# Patient Record
Sex: Male | Born: 1980 | Race: White | Hispanic: No | Marital: Married | Smoking: Former smoker
Health system: Southern US, Community
[De-identification: ages and names within clinical notes are randomized; demographics above are authoritative.]

## PROBLEM LIST (undated history)

## (undated) DIAGNOSIS — W57XXXA Bitten or stung by nonvenomous insect and other nonvenomous arthropods, initial encounter: Secondary | ICD-10-CM

## (undated) DIAGNOSIS — S43439A Superior glenoid labrum lesion of unspecified shoulder, initial encounter: Secondary | ICD-10-CM

## (undated) HISTORY — PX: SHOULDER ARTHROSCOPY W/ LABRAL REPAIR: SHX2399

## (undated) HISTORY — PX: KNEE SURGERY: SHX244

---

## 2010-08-01 ENCOUNTER — Encounter: Admission: RE | Admit: 2010-08-01 | Discharge: 2010-08-01 | Payer: Self-pay | Admitting: Occupational Medicine

## 2011-08-23 IMAGING — CR DG FOOT COMPLETE 3+V*R*
3 series · 3 of 3 positions shown · non-contrast
Comparison: No comparison studies available.

CLINICAL DATA: Crush injury to the right foot.

RIGHT FOOT COMPLETE - 3+ VIEW

[view not recorded (1 of 3)]
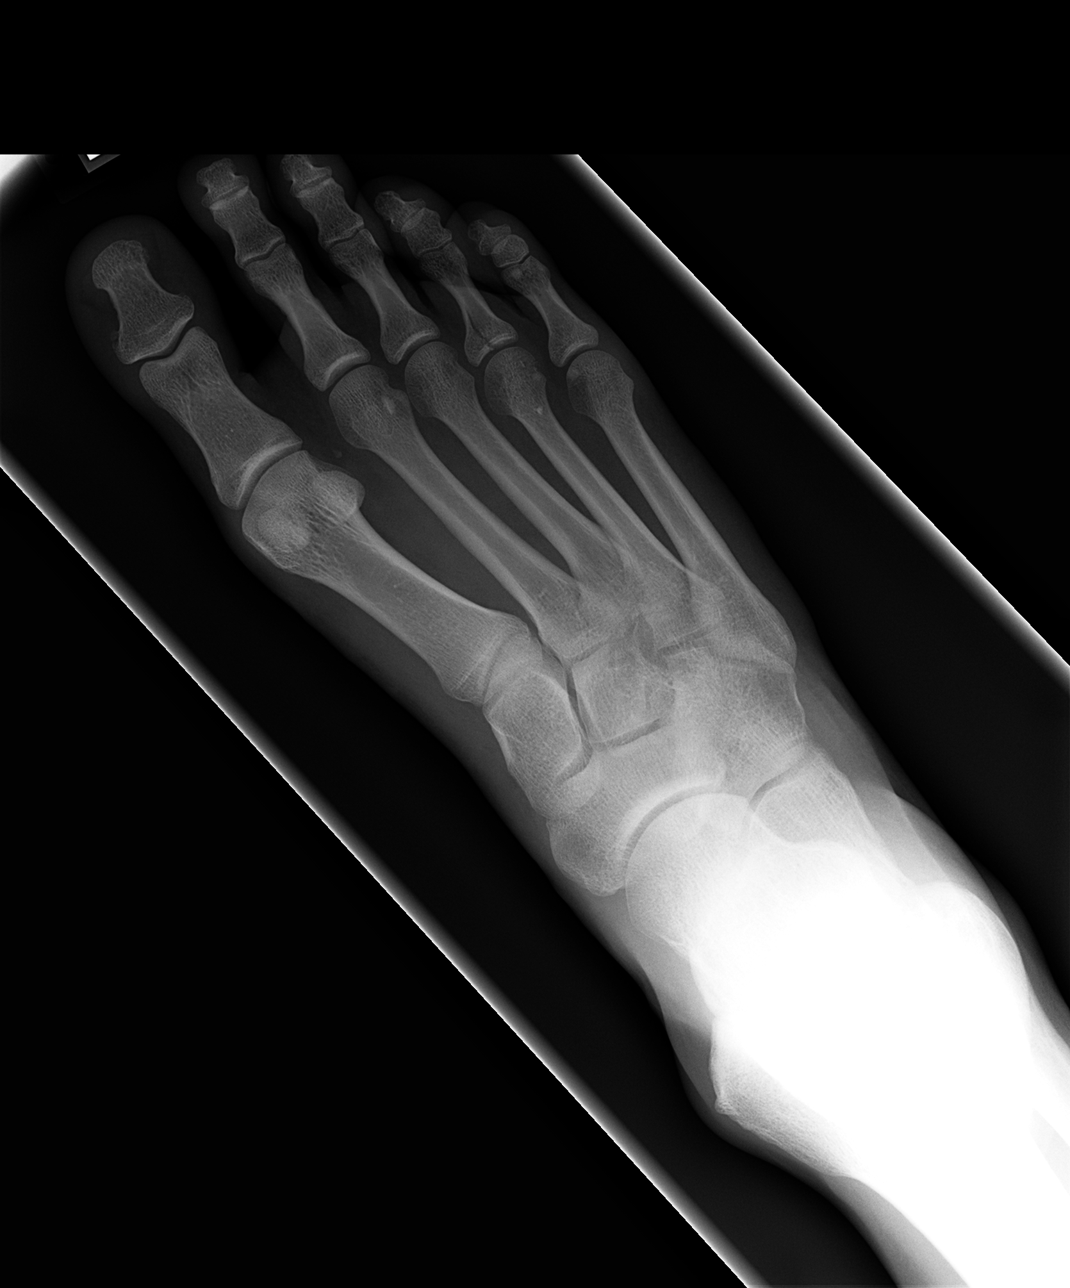

[view not recorded (2 of 3)]
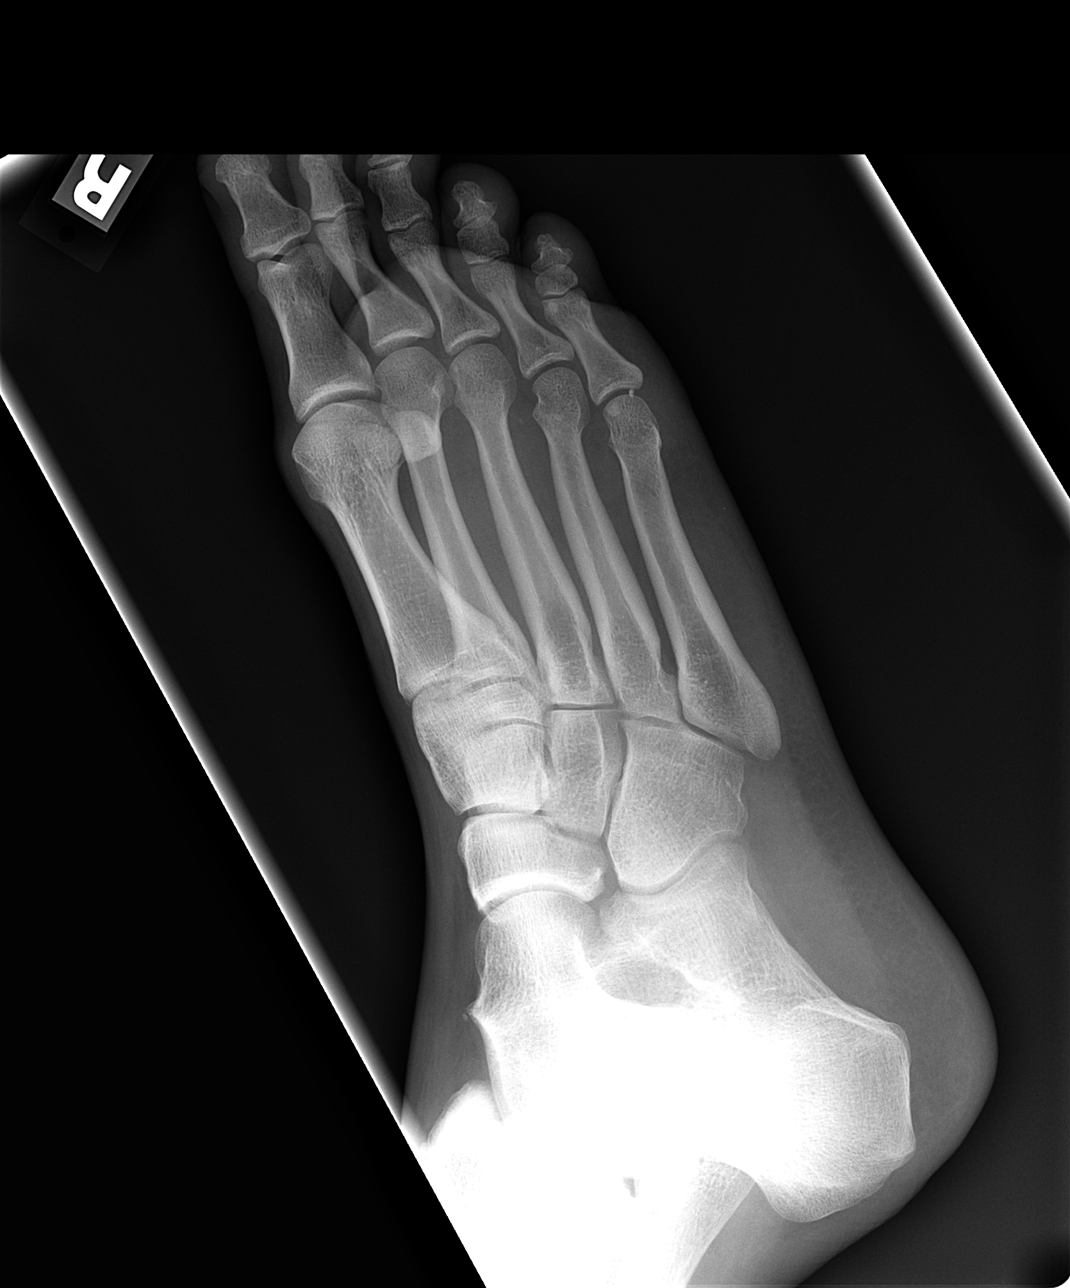

[view not recorded (3 of 3)]
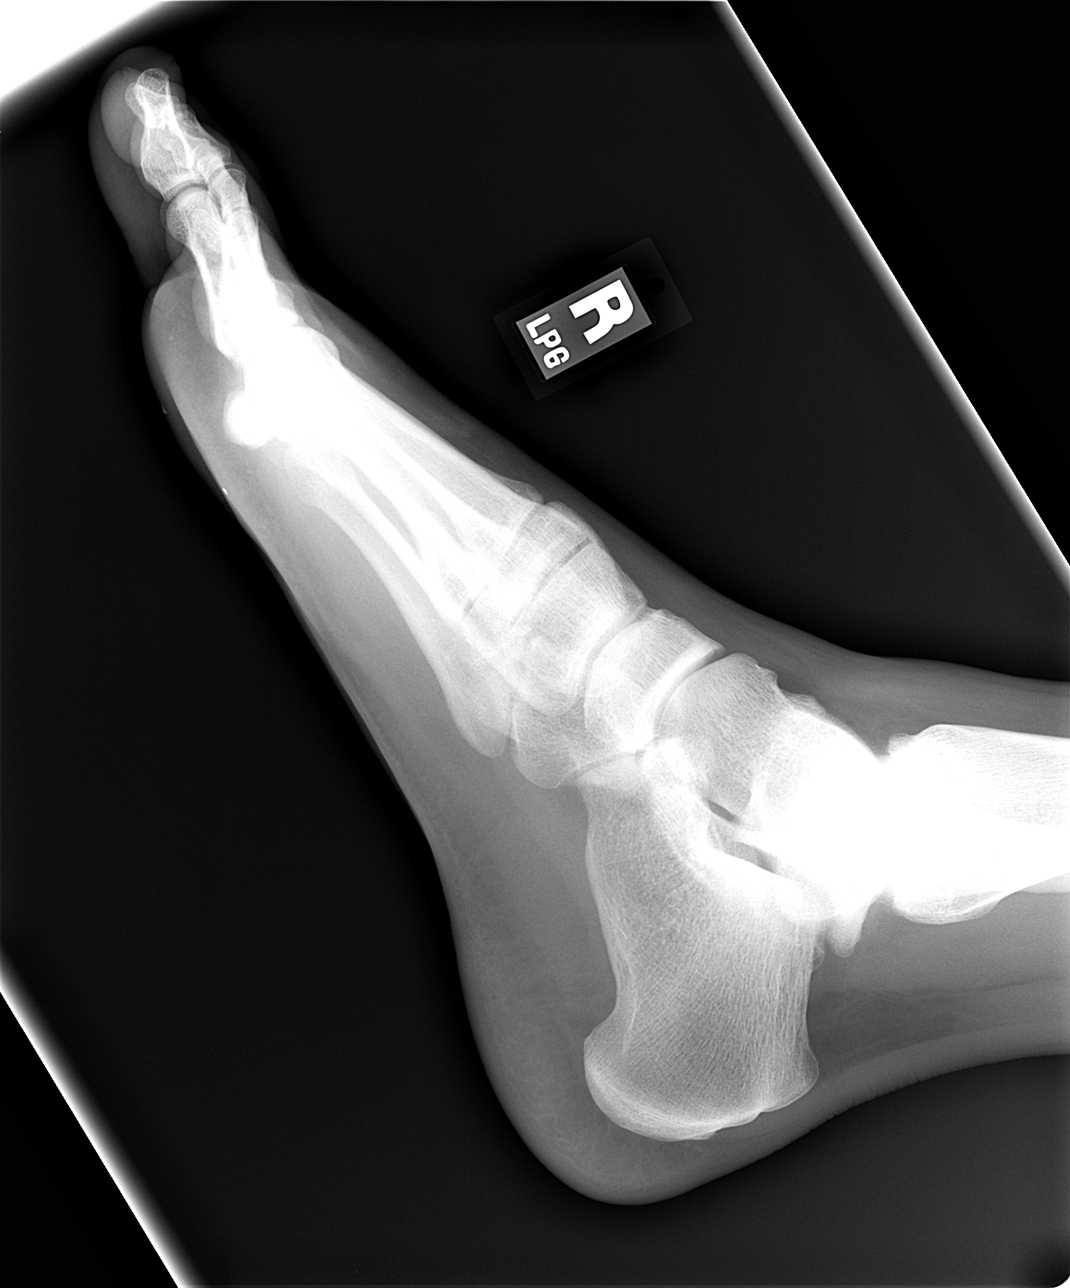

[3 of 3 positions shown; findings below may reference images not displayed]

FINDINGS: Pain sagittally oriented fractures identified at the base
of the fourth toe proximal phalanx.  The fracture line extends to
the articular surface.  No other acute fracture is evident.  The
patient has radiopaque debris along the skin of the plantar aspect
of the foot, at the level of the metatarsal phalangeal joints.
IMPRESSION: Sagittal fracture through the base of the fourth toe proximal
phalanx.

## 2014-04-25 DIAGNOSIS — S43439A Superior glenoid labrum lesion of unspecified shoulder, initial encounter: Secondary | ICD-10-CM

## 2014-04-25 HISTORY — DX: Superior glenoid labrum lesion of unspecified shoulder, initial encounter: S43.439A

## 2014-05-11 ENCOUNTER — Other Ambulatory Visit: Payer: Self-pay | Admitting: Orthopedic Surgery

## 2014-05-24 ENCOUNTER — Encounter (HOSPITAL_BASED_OUTPATIENT_CLINIC_OR_DEPARTMENT_OTHER): Payer: Self-pay | Admitting: *Deleted

## 2014-05-24 DIAGNOSIS — W57XXXA Bitten or stung by nonvenomous insect and other nonvenomous arthropods, initial encounter: Secondary | ICD-10-CM

## 2014-05-24 HISTORY — DX: Bitten or stung by nonvenomous insect and other nonvenomous arthropods, initial encounter: W57.XXXA

## 2014-05-30 ENCOUNTER — Encounter (HOSPITAL_BASED_OUTPATIENT_CLINIC_OR_DEPARTMENT_OTHER)
Admission: RE | Disposition: A | Discharge status: Home / Self Care | Payer: Self-pay | Source: Ambulatory Visit | Attending: Orthopedic Surgery

## 2014-05-30 ENCOUNTER — Encounter (HOSPITAL_BASED_OUTPATIENT_CLINIC_OR_DEPARTMENT_OTHER): Payer: BC Managed Care – PPO | Admitting: Anesthesiology

## 2014-05-30 ENCOUNTER — Ambulatory Visit (HOSPITAL_BASED_OUTPATIENT_CLINIC_OR_DEPARTMENT_OTHER)
Admission: RE | Admit: 2014-05-30 | Discharge: 2014-05-30 | Disposition: A | Discharge status: Home / Self Care | Payer: BC Managed Care – PPO | Source: Ambulatory Visit | Attending: Orthopedic Surgery | Admitting: Orthopedic Surgery

## 2014-05-30 ENCOUNTER — Encounter (HOSPITAL_BASED_OUTPATIENT_CLINIC_OR_DEPARTMENT_OTHER): Payer: Self-pay

## 2014-05-30 ENCOUNTER — Ambulatory Visit (HOSPITAL_BASED_OUTPATIENT_CLINIC_OR_DEPARTMENT_OTHER): Payer: BC Managed Care – PPO | Admitting: Anesthesiology

## 2014-05-30 DIAGNOSIS — F17200 Nicotine dependence, unspecified, uncomplicated: Secondary | ICD-10-CM | POA: Insufficient documentation

## 2014-05-30 DIAGNOSIS — S43432D Superior glenoid labrum lesion of left shoulder, subsequent encounter: Secondary | ICD-10-CM

## 2014-05-30 DIAGNOSIS — M24119 Other articular cartilage disorders, unspecified shoulder: Secondary | ICD-10-CM | POA: Diagnosis not present

## 2014-05-30 DIAGNOSIS — M25519 Pain in unspecified shoulder: Secondary | ICD-10-CM | POA: Diagnosis present

## 2014-05-30 HISTORY — PX: SHOULDER ARTHROSCOPY WITH LABRAL REPAIR: SHX5691

## 2014-05-30 HISTORY — DX: Superior glenoid labrum lesion of unspecified shoulder, initial encounter: S43.439A

## 2014-05-30 HISTORY — DX: Bitten or stung by nonvenomous insect and other nonvenomous arthropods, initial encounter: W57.XXXA

## 2014-05-30 LAB — POCT HEMOGLOBIN-HEMACUE: HEMOGLOBIN: 14.4 g/dL (ref 13.0–17.0)

## 2014-05-30 SURGERY — ARTHROSCOPY, SHOULDER, WITH GLENOID LABRUM REPAIR
Anesthesia: Regional | Site: Shoulder | Laterality: Left

## 2014-05-30 MED ORDER — MIDAZOLAM HCL 2 MG/2ML IJ SOLN
INTRAMUSCULAR | Status: AC
  Filled 2014-05-30: qty 2

## 2014-05-30 MED ORDER — SUCCINYLCHOLINE CHLORIDE 20 MG/ML IJ SOLN
INTRAMUSCULAR | Status: DC | PRN
  Administered 2014-05-30: 100 mg via INTRAVENOUS

## 2014-05-30 MED ORDER — MIDAZOLAM HCL 2 MG/2ML IJ SOLN
1.0000 mg | INTRAMUSCULAR | Status: DC | PRN
  Administered 2014-05-30: 2 mg via INTRAVENOUS

## 2014-05-30 MED ORDER — OXYCODONE HCL 5 MG/5ML PO SOLN: 5.0000 mg | Freq: Once | ORAL | Status: DC | PRN

## 2014-05-30 MED ORDER — FENTANYL CITRATE 0.05 MG/ML IJ SOLN
INTRAMUSCULAR | Status: AC
  Filled 2014-05-30: qty 2

## 2014-05-30 MED ORDER — ONDANSETRON HCL 4 MG/2ML IJ SOLN
INTRAMUSCULAR | Status: DC | PRN
  Administered 2014-05-30: 4 mg via INTRAVENOUS

## 2014-05-30 MED ORDER — DEXAMETHASONE SODIUM PHOSPHATE 4 MG/ML IJ SOLN
INTRAMUSCULAR | Status: DC | PRN
  Administered 2014-05-30: 10 mg via INTRAVENOUS

## 2014-05-30 MED ORDER — SODIUM CHLORIDE 0.9 % IR SOLN
Status: DC | PRN
  Administered 2014-05-30: 6000 mL

## 2014-05-30 MED ORDER — LIDOCAINE HCL (CARDIAC) 20 MG/ML IV SOLN
INTRAVENOUS | Status: DC | PRN
  Administered 2014-05-30: 30 mg via INTRAVENOUS

## 2014-05-30 MED ORDER — FENTANYL CITRATE 0.05 MG/ML IJ SOLN
50.0000 ug | INTRAMUSCULAR | Status: DC | PRN
  Administered 2014-05-30: 100 ug via INTRAVENOUS

## 2014-05-30 MED ORDER — PROPOFOL 10 MG/ML IV BOLUS
INTRAVENOUS | Status: DC | PRN
  Administered 2014-05-30: 200 mg via INTRAVENOUS

## 2014-05-30 MED ORDER — DOCUSATE SODIUM 100 MG PO CAPS: 100.0000 mg | ORAL_CAPSULE | Freq: Three times a day (TID) | ORAL | Status: AC | PRN

## 2014-05-30 MED ORDER — BUPIVACAINE-EPINEPHRINE (PF) 0.5% -1:200000 IJ SOLN
INTRAMUSCULAR | Status: DC | PRN
  Administered 2014-05-30: 30 mL via PERINEURAL

## 2014-05-30 MED ORDER — MIDAZOLAM HCL 2 MG/ML PO SYRP
12.0000 mg | ORAL_SOLUTION | Freq: Once | ORAL | Status: DC | PRN
Start: 2014-05-30 — End: 2014-05-30

## 2014-05-30 MED ORDER — POVIDONE-IODINE 7.5 % EX SOLN: Freq: Once | CUTANEOUS | Status: DC

## 2014-05-30 MED ORDER — OXYCODONE HCL 5 MG PO TABS: 5.0000 mg | ORAL_TABLET | Freq: Once | ORAL | Status: DC | PRN

## 2014-05-30 MED ORDER — OXYCODONE-ACETAMINOPHEN 5-325 MG PO TABS: 1.0000 | ORAL_TABLET | ORAL | Status: AC | PRN

## 2014-05-30 MED ORDER — OXYCODONE HCL 5 MG PO TABS
ORAL_TABLET | ORAL | Status: AC
  Filled 2014-05-30: qty 1

## 2014-05-30 MED ORDER — LACTATED RINGERS IV SOLN
INTRAVENOUS | Status: DC
  Administered 2014-05-30 (×2): via INTRAVENOUS

## 2014-05-30 MED ORDER — CEFAZOLIN SODIUM-DEXTROSE 2-3 GM-% IV SOLR
INTRAVENOUS | Status: AC
  Filled 2014-05-30: qty 50

## 2014-05-30 MED ORDER — HYDROMORPHONE HCL PF 1 MG/ML IJ SOLN: 0.2500 mg | INTRAMUSCULAR | Status: DC | PRN

## 2014-05-30 MED ORDER — ONDANSETRON HCL 4 MG/2ML IJ SOLN: 4.0000 mg | Freq: Four times a day (QID) | INTRAMUSCULAR | Status: DC | PRN

## 2014-05-30 MED ORDER — CEFAZOLIN SODIUM-DEXTROSE 2-3 GM-% IV SOLR
2.0000 g | INTRAVENOUS | Status: AC
  Administered 2014-05-30: 2 g via INTRAVENOUS

## 2014-05-30 SURGICAL SUPPLY — 90 items
ANCHOR SUT BIOCOMP LK 2.9X12.5 (Anchor) ×9 IMPLANT
BENZOIN TINCTURE PRP APPL 2/3 (GAUZE/BANDAGES/DRESSINGS) IMPLANT
BLADE SURG 15 STRL LF DISP TIS (BLADE) IMPLANT
BLADE SURG 15 STRL SS (BLADE)
BLADE SURG ROTATE 9660 (MISCELLANEOUS) IMPLANT
BUR 3.5 LG SPHERICAL (BURR) IMPLANT
BUR OVAL 4.0 (BURR) IMPLANT
BURR 3.5 LG SPHERICAL (BURR)
BURR 3.5MM LG SPHERICAL (BURR)
CANISTER SUCT 3000ML (MISCELLANEOUS) IMPLANT
CANNULA 5.75X71 LONG (CANNULA) ×3 IMPLANT
CANNULA TWIST IN 8.25X7CM (CANNULA) ×3 IMPLANT
CHLORAPREP W/TINT 26ML (MISCELLANEOUS) ×3 IMPLANT
CLOSURE WOUND 1/2 X4 (GAUZE/BANDAGES/DRESSINGS)
DECANTER SPIKE VIAL GLASS SM (MISCELLANEOUS) IMPLANT
DERMABOND ADVANCED (GAUZE/BANDAGES/DRESSINGS)
DERMABOND ADVANCED .7 DNX12 (GAUZE/BANDAGES/DRESSINGS) IMPLANT
DRAPE INCISE IOBAN 66X45 STRL (DRAPES) ×3 IMPLANT
DRAPE STERI 35X30 U-POUCH (DRAPES) ×3 IMPLANT
DRAPE SURG 17X23 STRL (DRAPES) ×3 IMPLANT
DRAPE U 20/CS (DRAPES) ×3 IMPLANT
DRAPE U-SHAPE 47X51 STRL (DRAPES) ×3 IMPLANT
DRAPE U-SHAPE 76X120 STRL (DRAPES) ×6 IMPLANT
DRSG PAD ABDOMINAL 8X10 ST (GAUZE/BANDAGES/DRESSINGS) ×3 IMPLANT
ELECT REM PT RETURN 9FT ADLT (ELECTROSURGICAL) ×3
ELECTRODE REM PT RTRN 9FT ADLT (ELECTROSURGICAL) ×1 IMPLANT
FIBERSTICK 2 (SUTURE) IMPLANT
GAUZE SPONGE 4X4 12PLY STRL (GAUZE/BANDAGES/DRESSINGS) ×3 IMPLANT
GAUZE SPONGE 4X4 16PLY XRAY LF (GAUZE/BANDAGES/DRESSINGS) IMPLANT
GAUZE XEROFORM 1X8 LF (GAUZE/BANDAGES/DRESSINGS) ×3 IMPLANT
GLOVE BIO SURGEON STRL SZ7 (GLOVE) ×3 IMPLANT
GLOVE BIO SURGEON STRL SZ7.5 (GLOVE) ×3 IMPLANT
GLOVE BIOGEL PI IND STRL 7.0 (GLOVE) ×1 IMPLANT
GLOVE BIOGEL PI IND STRL 8 (GLOVE) ×1 IMPLANT
GLOVE BIOGEL PI INDICATOR 7.0 (GLOVE) ×2
GLOVE BIOGEL PI INDICATOR 8 (GLOVE) ×2
GOWN STRL REUS W/ TWL LRG LVL3 (GOWN DISPOSABLE) ×2 IMPLANT
GOWN STRL REUS W/TWL LRG LVL3 (GOWN DISPOSABLE) ×4
GOWN STRL REUS W/TWL XL LVL4 (GOWN DISPOSABLE) ×3 IMPLANT
KIT BIO-SUTURETAK 2.4 SPR TROC (KITS) IMPLANT
KIT PUSHLOCK 2.9 HIP (KITS) ×3 IMPLANT
LASSO 90 CVE QUICKPAS (DISPOSABLE) ×3 IMPLANT
LASSO CRESCENT QUICKPASS (SUTURE) IMPLANT
MANIFOLD NEPTUNE II (INSTRUMENTS) ×3 IMPLANT
NDL SUT 6 .5 CRC .975X.05 MAYO (NEEDLE) IMPLANT
NEEDLE 1/2 CIR CATGUT .05X1.09 (NEEDLE) IMPLANT
NEEDLE MAYO TAPER (NEEDLE)
NS IRRIG 1000ML POUR BTL (IV SOLUTION) IMPLANT
PACK ARTHROSCOPY DSU (CUSTOM PROCEDURE TRAY) ×3 IMPLANT
PACK BASIN DAY SURGERY FS (CUSTOM PROCEDURE TRAY) ×3 IMPLANT
PENCIL BUTTON HOLSTER BLD 10FT (ELECTRODE) IMPLANT
RESECTOR FULL RADIUS 4.2MM (BLADE) ×3 IMPLANT
SHEET MEDIUM DRAPE 40X70 STRL (DRAPES) IMPLANT
SLEEVE SCD COMPRESS KNEE MED (MISCELLANEOUS) ×3 IMPLANT
SLING ARM IMMOBILIZER LRG (SOFTGOODS) ×3 IMPLANT
SLING ARM IMMOBILIZER MED (SOFTGOODS) IMPLANT
SLING ARM LRG ADULT FOAM STRAP (SOFTGOODS) IMPLANT
SLING ARM MED ADULT FOAM STRAP (SOFTGOODS) IMPLANT
SLING ARM XL FOAM STRAP (SOFTGOODS) IMPLANT
SPONGE LAP 4X18 X RAY DECT (DISPOSABLE) IMPLANT
STRIP CLOSURE SKIN 1/2X4 (GAUZE/BANDAGES/DRESSINGS) IMPLANT
SUCTION FRAZIER TIP 10 FR DISP (SUCTIONS) IMPLANT
SUPPORT WRAP ARM LG (MISCELLANEOUS) IMPLANT
SUT ETHIBOND 2 OS 4 DA (SUTURE) IMPLANT
SUT ETHILON 3 0 PS 1 (SUTURE) ×3 IMPLANT
SUT ETHILON 4 0 PS 2 18 (SUTURE) IMPLANT
SUT FIBERWIRE #2 38 T-5 BLUE (SUTURE)
SUT FIBERWIRE 2-0 18 17.9 3/8 (SUTURE)
SUT MNCRL AB 3-0 PS2 18 (SUTURE) IMPLANT
SUT MNCRL AB 4-0 PS2 18 (SUTURE) IMPLANT
SUT PDS AB 0 CT 36 (SUTURE) IMPLANT
SUT PROLENE 3 0 PS 2 (SUTURE) IMPLANT
SUT TIGER TAPE 7 IN WHITE (SUTURE) IMPLANT
SUT VIC AB 0 CT1 27 (SUTURE)
SUT VIC AB 0 CT1 27XBRD ANBCTR (SUTURE) IMPLANT
SUT VIC AB 2-0 SH 27 (SUTURE)
SUT VIC AB 2-0 SH 27XBRD (SUTURE) IMPLANT
SUTURE FIBERWR #2 38 T-5 BLUE (SUTURE) IMPLANT
SUTURE FIBERWR 2-0 18 17.9 3/8 (SUTURE) IMPLANT
SYR BULB 3OZ (MISCELLANEOUS) IMPLANT
TAPE LABRALWHITE 1.5X36 (TAPE) ×3 IMPLANT
TAPE SUT LABRALTAP WHT/BLK (SUTURE) ×6 IMPLANT
TOWEL OR 17X24 6PK STRL BLUE (TOWEL DISPOSABLE) ×3 IMPLANT
TOWEL OR NON WOVEN STRL DISP B (DISPOSABLE) ×3 IMPLANT
TUBE CONNECTING 20'X1/4 (TUBING) ×1
TUBE CONNECTING 20X1/4 (TUBING) ×2 IMPLANT
TUBING ARTHROSCOPY IRRIG 16FT (MISCELLANEOUS) ×3 IMPLANT
WAND STAR VAC 90 (SURGICAL WAND) IMPLANT
WATER STERILE IRR 1000ML POUR (IV SOLUTION) ×3 IMPLANT
YANKAUER SUCT BULB TIP NO VENT (SUCTIONS) IMPLANT

## 2014-05-30 NOTE — Anesthesia Postprocedure Evaluation (Signed)
Anesthesia Post Note  Patient: Sean NimrodMark Bush  Procedure(s) Performed: Procedure(s) (LRB): SHOULDER ARTHROSCOPY WITH LABRAL REPAIR (Left)  Anesthesia type: General  Patient location: PACU  Post pain: Pain level controlled and Adequate analgesia  Post assessment: Post-op Vital signs reviewed, Patient's Cardiovascular Status Stable, Respiratory Function Stable, Patent Airway and Pain level controlled  Last Vitals:  Filed Vitals:   05/30/14 1345  BP: 124/48  Pulse: 70  Temp:   Resp: 18    Post vital signs: Reviewed and stable  Level of consciousness: awake, alert  and oriented  Complications: No apparent anesthesia complications

## 2014-05-30 NOTE — Op Note (Signed)
Procedure(s): SHOULDER ARTHROSCOPY WITH LABRAL REPAIR Procedure Note  Sharyl NimrodMark Markin male 33 y.o. 05/30/2014  Procedure(s) and Anesthesia Type:    * LEFT SHOULDER ARTHROSCOPY WITH POSTERIOR LABRAL REPAIR - General  Surgeon(s) and Role:    * Mable ParisJustin [name] Faylene Allerton, MD - Primary     Surgeon: Mable ParisHANDLER,Elianah Karis [name]   Assistants: Damita Lackanielle Lalibert PA-C [facility](Danielle was present and scrubbed throughout the procedure and was essential in positioning, assisting with the camera and instrumentation,, and closure)  Anesthesia: General endotracheal anesthesia with preoperative interscalene block given by the attending anesthesiologist    Procedure Detail  SHOULDER ARTHROSCOPY WITH LABRAL REPAIR  Estimated Blood Loss: Min         Drains: none  Blood Given: none         Specimens: none        Complications:  * No complications entered in OR log *         Disposition: PACU - hemodynamically stable.         Condition: stable    Procedure:   INDICATIONS FOR SURGERY: The patient is 33 y.o. male who has had multiple injuries to his left shoulder in the past with the most recent 1 point basketball. He had an MRI which revealed Mr. labral tear a small para labral cyst. He was indicated for surgical treatment to decrease pain and instability symptoms. He understood risks benefits alternatives to the procedure was to go forward with surgery.  OPERATIVE FINDINGS: Examination under anesthesia: 2+ posterior laxity compared to 1+ posterior laxity in the contralateral side. No anterior or inferior instability.  Diagnostic Arthroscopy: Rotator cuff was completely intact. Biceps tendon superior labrum and anterior labrum were intact. Glenohumeral joint surfaces were intact. Posterior labrum was torn from about the 10:00 to 6:00 position. It was repaired with 3 2.9 bicomposite push lock anchors with labral tapes.  DESCRIPTION OF PROCEDURE: The patient was identified in preoperative  holding area where  I personally marked the operative site after  verifying site, side, and procedure with the patient. An interscalene block was given by the attending anesthesiologist the holding area.  The patient was taken back to the operating room where general anesthesia was induced without complication and was placed in the beach-chair position with the back  elevated about 60 degrees and all extremities and head and neck carefully padded and  positioned.   The left upper extremity was then prepped and  draped in a standard sterile fashion. The appropriate time-out  procedure was carried out. The patient did receive IV antibiotics  within 30 minutes of incision.   A small high lateral posterior portal incision was made and the arthroscope was introduced into the joint. An anterior portal was then established above the subscapularis using needle localization. Small cannula was placed anteriorly. Diagnostic arthroscopy was then carried out with findings as described above.  Once the diagnosis of posterior labral tear was confirmed and no other pathology was found a high lateral rotator interval portal was established the camera was moved to this position. A large cannula was placed then in the posterior portal position and was noted to be of appropriate position and angle for repair the posterior labrum. The arm was slightly externally rotated. The arthroscopic elevator was used to completely free up the tear from about the 10:00 to 6:00 position. Shaver and rasp were used to promote healing. A 90 suture lasso was then used to pass a labral tape through the inferior capsular ligamentous tissue in the inferior most  aspect of the labral tear and then was brought to an anchor at about the 7:00 position reconstituting the tension of the posterior inferior glenohumeral  ligament nicely. 2 additional anchors were then placed in the similar fashion advancing the tissue laterally and superiorly with each anchor. This  completely repaired the posterior labral tear. Anchors and sutures were felt to be in excellent position.   The arthroscopic equipment was removed from the joint and the portals were closed with 3-0 nylon in an interrupted fashion. Sterile dressings were then applied including Xeroform 4 x 4's ABDs and tape. The patient was then allowed to awaken from general anesthesia, placed in a sling, transferred to the stretcher and taken to the recovery room in stable condition.   POSTOPERATIVE PLAN: The patient will be discharged home today and will followup in one week for suture removal and wound check.  He'll follow the standard labral protocol.

## 2014-05-30 NOTE — Anesthesia Procedure Notes (Addendum)
Anesthesia Regional Block:  Interscalene brachial plexus block  Pre-Anesthetic Checklist: ,, timeout performed, Correct Patient, Correct Site, Correct Laterality, Correct Procedure, Correct Position, site marked, Risks and benefits discussed,  Surgical consent,  Pre-op evaluation,  At surgeon's request and post-op pain management  Laterality: Left  Prep: chloraprep       Needles:  Injection technique: Single-shot  Needle Type: Echogenic Stimulator Needle     Needle Length: 5cm 5 cm Needle Gauge: 22 and 22 G    Additional Needles:  Procedures: ultrasound guided (picture in chart) and nerve stimulator Interscalene brachial plexus block  Nerve Stimulator or Paresthesia:  Response: biceps flexion, 0.45 mA,   Additional Responses:   Narrative:  Start time: 05/30/2014 11:30 AM End time: 05/30/2014 11:36 AM Injection made incrementally with aspirations every 5 mL.  Performed by: Personally  Anesthesiologist: Dr Marcie Bal  Additional Notes: Functioning IV was confirmed and monitors were applied.  A 70m 22ga Arrow echogenic stimulator needle was used. Sterile prep and drape,hand hygiene and sterile gloves were used.  Negative aspiration and negative test dose prior to incremental administration of local anesthetic. The patient tolerated the procedure well.  Ultrasound guidance: relevent anatomy identified, needle position confirmed, local anesthetic spread visualized around nerve(s), vascular puncture avoided.  Image printed for medical record.    Procedure Name: Intubation Date/Time: 05/30/2014 12:02 PM Performed by: Jori Frerichs Pre-anesthesia Checklist: Patient identified, Emergency Drugs available, Suction available and Patient being monitored Patient Re-evaluated:Patient Re-evaluated prior to inductionOxygen Delivery Method: Circle System Utilized Preoxygenation: Pre-oxygenation with 100% oxygen Intubation Type: IV induction Ventilation: Mask ventilation without  difficulty Laryngoscope Size: Mac and 3 Grade View: Grade I Tube type: Oral Tube size: 8.0 mm Number of attempts: 1 Airway Equipment and Method: stylet and LTA kit utilized Placement Confirmation: ETT inserted through vocal cords under direct vision,  positive ETCO2 and breath sounds checked- equal and bilateral Secured at: 21 cm Tube secured with: Tape Dental Injury: Teeth and Oropharynx as per pre-operative assessment

## 2014-05-30 NOTE — Anesthesia Preprocedure Evaluation (Signed)
Anesthesia Evaluation  Patient identified by MRN, date of birth, ID band Patient awake    Reviewed: Allergy & Precautions, H&P , NPO status , Patient's Chart, lab work & pertinent test results  Airway Mallampati: II  Neck ROM: full    Dental   Pulmonary Current Smoker,          Cardiovascular negative cardio ROS      Neuro/Psych    GI/Hepatic   Endo/Other  obese  Renal/GU      Musculoskeletal   Abdominal   Peds  Hematology   Anesthesia Other Findings   Reproductive/Obstetrics                           Anesthesia Physical Anesthesia Plan  ASA: II  Anesthesia Plan: General and Regional   Post-op Pain Management: MAC Combined w/ Regional for Post-op pain   Induction: Intravenous  Airway Management Planned: Oral ETT  Additional Equipment:   Intra-op Plan:   Post-operative Plan: Extubation in OR  Informed Consent: I have reviewed the patients History and Physical, chart, labs and discussed the procedure including the risks, benefits and alternatives for the proposed anesthesia with the patient or authorized representative who has indicated his/her understanding and acceptance.     Plan Discussed with: CRNA, Anesthesiologist and Surgeon  Anesthesia Plan Comments:         Anesthesia Quick Evaluation

## 2014-05-30 NOTE — Progress Notes (Signed)
Assisted Dr. Hodierne with left, ultrasound guided, interscalene  block. Side rails up, monitors on throughout procedure. See vital signs in flow sheet. Tolerated Procedure well. 

## 2014-05-30 NOTE — Discharge Instructions (Signed)
Discharge Instructions after Arthroscopic Shoulder Repair ° ° °A sling has been provided for you. Remain in your sling at all times. This includes sleeping in your sling.  °Use ice on the shoulder intermittently over the first 48 hours after surgery.  °Pain medicine has been prescribed for you.  °Use your medicine liberally over the first 48 hours, and then you can begin to taper your use. You may take Extra Strength Tylenol or Tylenol only in place of the pain pills. DO NOT take ANY nonsteroidal anti-inflammatory pain medications: Advil, Motrin, Ibuprofen, Aleve, Naproxen, or Narprosyn.  °You may remove your dressing after two days. If the incision sites are still moist, place a Band-Aid over the moist site(s). Change Band-Aids daily until dry.  °You may shower 5 days after surgery. The incisions CANNOT get wet prior to 5 days. Simply allow the water to wash over the site and then pat dry. Do not rub the incisions. Make sure your axilla (armpit) is completely dry after showering.  °Take one aspirin a day for 2 weeks after surgery, unless you have an aspirin sensitivity/ allergy or asthma. ° ° °Please call [phone] during normal business hours or [phone] after hours for any problems. Including the following: ° °- excessive redness of the incisions °- drainage for more than 4 days °- fever of more than 101.5 F ° °*Please note that pain medications will not be refilled after hours or on weekends. ° ° °Post Anesthesia Home Care Instructions ° °Activity: °Get plenty of rest for the remainder of the day. A responsible adult should stay with you for 24 hours following the procedure.  °For the next 24 hours, DO NOT: °-Drive a car °-Operate machinery °-Drink alcoholic beverages °-Take any medication unless instructed by your physician °-Make any legal decisions or sign important papers. ° °Meals: °Start with liquid foods such as gelatin or soup. Progress to regular foods as tolerated. Avoid greasy, spicy, heavy  foods. If nausea and/or vomiting occur, drink only clear liquids until the nausea and/or vomiting subsides. Call your physician if vomiting continues. ° °Special Instructions/Symptoms: °Your throat may feel dry or sore from the anesthesia or the breathing tube placed in your throat during surgery. If this causes discomfort, gargle with warm salt water. The discomfort should disappear within 24 hours. ° ° °

## 2014-05-30 NOTE — Transfer of Care (Signed)
Immediate Anesthesia Transfer of Care Note  Patient: Sean NimrodMark Craighead  Procedure(s) Performed: Procedure(s) with comments: SHOULDER ARTHROSCOPY WITH LABRAL REPAIR (Left) - Left shoulder arthroscopy posterior labral repair  Patient Location: PACU  Anesthesia Type:GA combined with regional for post-op pain  Level of Consciousness: awake and sedated  Airway & Oxygen Therapy: Patient Spontanous Breathing and Patient connected to face mask oxygen  Post-op Assessment: Report given to PACU RN and Post -op Vital signs reviewed and stable  Post vital signs: Reviewed and stable  Complications: No apparent anesthesia complications

## 2014-05-30 NOTE — H&P (Signed)
Sean Bush is an 33 y.o. male.   Chief Complaint: L shoulder pain  HPI: L shoulder posterior labral tear, failed conservative treatment.  Past Medical History  Diagnosis Date  . Labral tear of shoulder 04/2014    left posterior  . Insect bites 05/24/2014    Past Surgical History  Procedure Laterality Date  . Shoulder arthroscopy w/ labral repair Right     History reviewed. No pertinent family history. Social History:  reports that he has been smoking Cigarettes.  He has been smoking about 0.00 packs per day for the past 5 years. His smokeless tobacco use includes Chew. He reports that he drinks alcohol. He reports that he does not use illicit drugs.  Allergies: No Known Allergies  No prescriptions prior to admission    No results found for this or any previous visit (from the past 48 hour(s)). No results found.  Review of Systems  All other systems reviewed and are negative.   Blood pressure 128/74, pulse 57, temperature 98 F (36.7 C), temperature source Oral, resp. rate 17, height 6\' 4"  (1.93 m), weight 114.306 kg (252 lb), SpO2 100.00%. Physical Exam  Constitutional: He is oriented to person, place, and time. He appears well-developed and well-nourished.  HENT:  Head: Atraumatic.  Eyes: EOM are normal.  Cardiovascular: Intact distal pulses.   Respiratory: Effort normal.  Musculoskeletal:  L shoulder pain with posterior stress, no weakness.  Neurological: He is alert and oriented to person, place, and time.  Skin: Skin is warm and dry.  Psychiatric: He has a normal mood and affect.     Assessment/Plan L shoulder posterior labral tear Plan arth post labral repair. Risks / benefits of surgery discussed Consent on chart  NPO for OR Preop antibiotics   Sean Bush Sean Bush 05/30/2014, 11:52 AM

## 2014-06-03 ENCOUNTER — Encounter (HOSPITAL_BASED_OUTPATIENT_CLINIC_OR_DEPARTMENT_OTHER): Payer: Self-pay | Admitting: Orthopedic Surgery

## 2014-06-15 ENCOUNTER — Encounter (HOSPITAL_BASED_OUTPATIENT_CLINIC_OR_DEPARTMENT_OTHER): Payer: Self-pay | Admitting: Orthopedic Surgery

## 2018-05-12 ENCOUNTER — Ambulatory Visit (HOSPITAL_COMMUNITY)
Admission: RE | Admit: 2018-05-12 | Discharge: 2018-05-12 | Disposition: A | Discharge status: Home / Self Care | Payer: BLUE CROSS/BLUE SHIELD | Source: Ambulatory Visit | Attending: Obstetrics and Gynecology | Admitting: Obstetrics and Gynecology

## 2020-08-24 ENCOUNTER — Other Ambulatory Visit: Payer: Self-pay

## 2020-08-24 DIAGNOSIS — Z20822 Contact with and (suspected) exposure to covid-19: Secondary | ICD-10-CM

## 2020-08-25 LAB — SARS-COV-2, NAA 2 DAY TAT

## 2020-08-25 LAB — NOVEL CORONAVIRUS, NAA: SARS-CoV-2, NAA: NOT DETECTED

## 2020-09-14 ENCOUNTER — Other Ambulatory Visit: Payer: Self-pay

## 2020-09-14 DIAGNOSIS — Z20822 Contact with and (suspected) exposure to covid-19: Secondary | ICD-10-CM

## 2020-09-16 LAB — SARS-COV-2, NAA 2 DAY TAT

## 2020-09-16 LAB — NOVEL CORONAVIRUS, NAA: SARS-CoV-2, NAA: NOT DETECTED

## 2021-06-06 ENCOUNTER — Ambulatory Visit: Payer: Self-pay | Attending: Critical Care Medicine

## 2021-06-06 DIAGNOSIS — Z20822 Contact with and (suspected) exposure to covid-19: Secondary | ICD-10-CM | POA: Insufficient documentation

## 2021-06-07 LAB — SARS-COV-2, NAA 2 DAY TAT

## 2021-06-07 LAB — NOVEL CORONAVIRUS, NAA: SARS-CoV-2, NAA: NOT DETECTED

## 2021-07-04 ENCOUNTER — Encounter (HOSPITAL_BASED_OUTPATIENT_CLINIC_OR_DEPARTMENT_OTHER): Payer: Self-pay

## 2021-07-04 DIAGNOSIS — G4700 Insomnia, unspecified: Secondary | ICD-10-CM

## 2021-08-19 ENCOUNTER — Ambulatory Visit (HOSPITAL_BASED_OUTPATIENT_CLINIC_OR_DEPARTMENT_OTHER): Payer: Self-pay | Attending: Infectious Diseases | Admitting: Internal Medicine

## 2023-02-15 ENCOUNTER — Ambulatory Visit (INDEPENDENT_AMBULATORY_CARE_PROVIDER_SITE_OTHER): Payer: No Typology Code available for payment source

## 2023-02-15 ENCOUNTER — Ambulatory Visit
Admission: EM | Admit: 2023-02-15 | Discharge: 2023-02-15 | Disposition: A | Discharge status: Home / Self Care | Payer: No Typology Code available for payment source | Attending: Emergency Medicine | Admitting: Emergency Medicine

## 2023-02-15 DIAGNOSIS — M25532 Pain in left wrist: Secondary | ICD-10-CM

## 2023-02-15 NOTE — Discharge Instructions (Addendum)
Take ibuprofen or Tylenol as directed.  Rest and elevate your wrist.  Apply ice packs 2-3 times a day for up to 20 minutes each.  Wear the wrist brace as needed for comfort.    Follow up with an orthopedist.

## 2023-02-15 NOTE — ED Provider Notes (Signed)
Roderic Palau    CSN: [id number] Arrival date & time: 02/15/23  1106      History   Chief Complaint Chief Complaint  Patient presents with   Wrist Pain    Sprain possible hairline, want xray.[facility] - Entered by patient    HPI Sean Bush is a 42 y.o. male.  Patient presents with left wrist pain x 5 weeks after he fell while playing basketball.  This morning, he fell again while chasing a chicken and landed on his wrist.  He has pain with movement.  No open wounds, bruising, redness, numbness, weakness, or other symptoms.  No OTC medications.   The history is provided by the patient and medical records.    Past Medical History:  Diagnosis Date   Insect bites 05/24/2014   Labral tear of shoulder 04/2014   left posterior    Patient Active Problem List   Diagnosis Date Noted   Wrist pain, acute, left 02/15/2023    Past Surgical History:  Procedure Laterality Date   KNEE SURGERY Bilateral    2020/ 2018   SHOULDER ARTHROSCOPY W/ LABRAL REPAIR Right    SHOULDER ARTHROSCOPY WITH LABRAL REPAIR Left 05/30/2014   Procedure: SHOULDER ARTHROSCOPY WITH LABRAL REPAIR;  Surgeon: Nita Sells, MD;  Location: [place];  Service: Orthopedics;  Laterality: Left;  Left shoulder arthroscopy posterior labral repair       Home Medications    Prior to Admission medications   Medication Sig Start Date End Date Taking? Authorizing Provider  buPROPion (WELLBUTRIN XL) 150 MG 24 hr tablet TAKE ONE TABLET BY MOUTH EVERY MORNING FOR DEPRESSION 01/16/23  Yes [provider]  cyclobenzaprine (FLEXERIL) 10 MG tablet TAKE ONE TABLET BY MOUTH DAILY AS NEEDED FOR MUSCLE SPASM 07/22/22 07/23/23 Yes [provider]  FLUoxetine (PROZAC) 20 MG capsule Take 1 tablet by mouth daily. 11/22/22  Yes [provider]  fluticasone (FLONASE) 50 MCG/ACT nasal spray INSTILL 2 SPRAYS IN EACH NOSTRIL DAILY AS NEEDED FOR ALLERGIES 07/22/22  07/23/23 Yes [provider]  hydrOXYzine (ATARAX) 10 MG tablet TAKE ONE TABLET BY MOUTH EVERY 6 HOURS AS NEEDED FOR ANXIETY/AGITATION 11/22/22  Yes [provider]  docusate sodium (COLACE) 100 MG capsule Take 1 capsule (100 mg total) by mouth 3 (three) times daily as needed. Patient not taking: Reported on 02/15/2023 05/30/14   Grier Mitts, PA-C  oxyCODONE-acetaminophen (ROXICET) 5-325 MG per tablet Take 1-2 tablets by mouth every 4 (four) hours as needed for severe pain. Patient not taking: Reported on 02/15/2023 05/30/14   Grier Mitts, PA-C    Family History History reviewed. No pertinent family history.  Social History Social History   Tobacco Use   Smoking status: Every Day    Years: 5    Types: Cigarettes   Smokeless tobacco: Current    Types: Chew   Tobacco comments:    1 cig./day  Substance Use Topics   Alcohol use: Yes    Comment: occasionally   Drug use: No     Allergies   Patient has no known allergies.   Review of Systems Review of Systems  Constitutional:  Negative for chills and fever.  Musculoskeletal:  Positive for arthralgias. Negative for joint swelling.  Skin:  Negative for color change, rash and wound.  Neurological:  Negative for weakness and numbness.  All other systems reviewed and are negative.    Physical Exam Triage Vital Signs ED Triage Vitals  Enc Vitals Group  BP 02/15/23 1131 136/85     Pulse Rate 02/15/23 1124 61     Resp 02/15/23 1124 18     Temp 02/15/23 1124 98 F (36.7 C)     Temp src --      SpO2 02/15/23 1124 97 %     Weight 02/15/23 1128 230 lb (104.3 kg)     Height 02/15/23 1128 6\' 4"  (1.93 m)     Head Circumference --      Peak Flow --      Pain Score 02/15/23 1128 3     Pain Loc --      Pain Edu? --      Excl. in [place]? --    No data found.  Updated Vital Signs BP 136/85   Pulse 61   Temp 98 F (36.7 C)   Resp 18   Ht 6\' 4"  (1.93 m)   Wt 230 lb (104.3 kg)   SpO2 97%   BMI  28.00 kg/m   Visual Acuity Right Eye Distance:   Left Eye Distance:   Bilateral Distance:    Right Eye Near:   Left Eye Near:    Bilateral Near:     Physical Exam Vitals and nursing note reviewed.  Constitutional:      General: He is not in acute distress.    Appearance: Normal appearance. He is well-developed. He is not ill-appearing.  HENT:     Mouth/Throat:     Mouth: Mucous membranes are moist.  Cardiovascular:     Rate and Rhythm: Normal rate and regular rhythm.  Pulmonary:     Effort: Pulmonary effort is normal. No respiratory distress.  Musculoskeletal:        General: Tenderness present. No swelling or deformity.       Arms:     Cervical back: Neck supple.  Skin:    General: Skin is warm and dry.     Capillary Refill: Capillary refill takes less than 2 seconds.     Findings: No bruising, erythema, lesion or rash.  Neurological:     General: No focal deficit present.     Mental Status: He is alert and oriented to person, place, and time.     Sensory: No sensory deficit.     Motor: No weakness.  Psychiatric:        Mood and Affect: Mood normal.        Behavior: Behavior normal.      UC Treatments / Results  Labs (all labs ordered are listed, but only abnormal results are displayed) Labs Reviewed - No data to display  EKG   Radiology DG Wrist Complete Left  Result Date: 02/15/2023 CLINICAL DATA:  Left wrist pain after injury 5 weeks ago. EXAM: LEFT WRIST - COMPLETE 3+ VIEW COMPARISON:  None Available. FINDINGS: There is no evidence of fracture or dislocation. There is no evidence of arthropathy or other focal bone abnormality. Soft tissues are unremarkable. IMPRESSION: Negative. Electronically Signed   By: Marijo Conception M.D.   On: 02/15/2023 11:48    Procedures Procedures (including critical care time)  Medications Ordered in UC Medications - No data to display  Initial Impression / Assessment and Plan / UC Course  I have reviewed the triage  vital signs and the nursing notes.  Pertinent labs & imaging results that were available during my care of the patient were reviewed by me and considered in my medical decision making (see chart for details).   Left wrist pain.  Xray negative.  Treating with wrist brace, rest, elevation, ice packs, ibuprofen or Tylenol.  Education provided on wrist pain.  Instructed patient to follow-up with orthopedist.  Contact information for on-call Ortho provided but patient has seen [facility] in the past and may follow up there.  He agrees to plan of care.    Final Clinical Impressions(s) / UC Diagnoses   Final diagnoses:  Wrist pain, acute, left     Discharge Instructions      Take ibuprofen or Tylenol as directed.  Rest and elevate your wrist.  Apply ice packs 2-3 times a day for up to 20 minutes each.  Wear the wrist brace as needed for comfort.    Follow up with an orthopedist.        ED Prescriptions   None    PDMP not reviewed this encounter.   Sharion Balloon, NP 02/15/23 [phone]

## 2023-02-15 NOTE — ED Triage Notes (Signed)
Patient to Urgent Care with complaints of left sided wrist pain following an injury five weeks ago. Reports he fell multiple times when playing basketball. States that this morning he was chasing a chicken, slipped and fell and caught himself again on his left wrist.  Reports pain has not improved over the last five weeks. Significant pain when flexing. Denies any swelling.

## 2023-06-15 ENCOUNTER — Ambulatory Visit
Admission: EM | Admit: 2023-06-15 | Discharge: 2023-06-15 | Disposition: A | Discharge status: Home / Self Care | Payer: No Typology Code available for payment source | Attending: Emergency Medicine | Admitting: Emergency Medicine

## 2023-06-15 ENCOUNTER — Ambulatory Visit (INDEPENDENT_AMBULATORY_CARE_PROVIDER_SITE_OTHER): Payer: No Typology Code available for payment source

## 2023-06-15 DIAGNOSIS — S62651A Nondisplaced fracture of medial phalanx of left index finger, initial encounter for closed fracture: Secondary | ICD-10-CM

## 2023-06-15 DIAGNOSIS — M79645 Pain in left finger(s): Secondary | ICD-10-CM | POA: Diagnosis not present

## 2023-06-15 DIAGNOSIS — M79671 Pain in right foot: Secondary | ICD-10-CM

## 2023-06-15 DIAGNOSIS — M7731 Calcaneal spur, right foot: Secondary | ICD-10-CM

## 2023-06-15 NOTE — ED Provider Notes (Signed)
Sean Bush    CSN: [id number] Arrival date & time: 06/15/23  7829      History   Chief Complaint Chief Complaint  Patient presents with   Ankle Pain        Finger Injury    HPI Sean Bush is a 42 y.o. male.   42 year old male pt, Sean Bush, presents to Urgent Care, for evaluation of right heel and left index finger pain injured playing 06/14/2023. Pt states he was playing basketball and "jammed" left index finger and right heel started hurting. Pt denies rolling his ankle.  The history is provided by the patient. No language interpreter was used.    Past Medical History:  Diagnosis Date   Insect bites 05/24/2014   Labral tear of shoulder 04/2014   left posterior    Patient Active Problem List   Diagnosis Date Noted   Finger pain, left 06/15/2023   Pain of right heel 06/15/2023   Closed nondisplaced fracture of middle phalanx of left index finger 06/15/2023   Heel spur, right 06/15/2023   Wrist pain, acute, left 02/15/2023    Past Surgical History:  Procedure Laterality Date   KNEE SURGERY Bilateral    2020/ 2018   SHOULDER ARTHROSCOPY W/ LABRAL REPAIR Right    SHOULDER ARTHROSCOPY WITH LABRAL REPAIR Left 05/30/2014   Procedure: SHOULDER ARTHROSCOPY WITH LABRAL REPAIR;  Surgeon: Mable Paris, MD;  Location: [facility];  Service: Orthopedics;  Laterality: Left;  Left shoulder arthroscopy posterior labral repair       Home Medications    Prior to Admission medications   Medication Sig Start Date End Date Taking? Authorizing Provider  buPROPion (WELLBUTRIN XL) 150 MG 24 hr tablet TAKE ONE TABLET BY MOUTH EVERY MORNING FOR DEPRESSION 01/16/23  Yes [provider]  cyclobenzaprine (FLEXERIL) 10 MG tablet TAKE ONE TABLET BY MOUTH DAILY AS NEEDED FOR MUSCLE SPASM 07/22/22 07/23/23 Yes [provider]  fluticasone (FLONASE) 50 MCG/ACT nasal spray INSTILL 2 SPRAYS IN EACH NOSTRIL DAILY AS NEEDED FOR ALLERGIES  07/22/22 07/23/23 Yes [provider]  hydrOXYzine (ATARAX) 10 MG tablet TAKE ONE TABLET BY MOUTH EVERY 6 HOURS AS NEEDED FOR ANXIETY/AGITATION 11/22/22  Yes [provider]  docusate sodium (COLACE) 100 MG capsule Take 1 capsule (100 mg total) by mouth 3 (three) times daily as needed. Patient not taking: Reported on 02/15/2023 05/30/14   Jiles Harold, PA-C  FLUoxetine (PROZAC) 20 MG capsule Take 1 tablet by mouth daily. 11/22/22   [provider]  oxyCODONE-acetaminophen (ROXICET) 5-325 MG per tablet Take 1-2 tablets by mouth every 4 (four) hours as needed for severe pain. Patient not taking: Reported on 02/15/2023 05/30/14   Jiles Harold, PA-C    Family History History reviewed. No pertinent family history.  Social History Social History   Tobacco Use   Smoking status: Former    Types: Cigarettes   Smokeless tobacco: Current    Types: Chew   Tobacco comments:    1 cig./day  Vaping Use   Vaping status: Never Used  Substance Use Topics   Alcohol use: Not Currently    Comment: occasionally   Drug use: No     Allergies   Patient has no known allergies.   Review of Systems Review of Systems  Constitutional:  Negative for fever.  Musculoskeletal:  Positive for arthralgias, gait problem and joint swelling.  Skin:  Positive for color change.  All other systems reviewed and are negative.  Physical Exam Triage Vital Signs ED Triage Vitals  Encounter Vitals Group     BP 06/15/23 0944 (!) 145/94     Systolic BP Percentile --      Diastolic BP Percentile --      Pulse Rate 06/15/23 0944 (!) 52     Resp 06/15/23 0944 14     Temp 06/15/23 0944 (!) 97.5 F (36.4 C)     Temp Source 06/15/23 0944 Temporal     SpO2 06/15/23 0944 98 %     Weight 06/15/23 0949 250 lb (113.4 kg)     Height 06/15/23 0949 6\' 4"  (1.93 m)     Head Circumference --      Peak Flow --      Pain Score 06/15/23 0949 8     Pain Loc --      Pain Education --       Exclude from Growth Chart --    No data found.  Updated Vital Signs BP (!) 145/94 (BP Location: Left Arm)   Pulse (!) 52   Temp (!) 97.5 F (36.4 C) (Temporal)   Resp 14   Ht 6\' 4"  (1.93 m)   Wt 250 lb (113.4 kg)   SpO2 98%   BMI 30.43 kg/m   Visual Acuity Right Eye Distance:   Left Eye Distance:   Bilateral Distance:    Right Eye Near:   Left Eye Near:    Bilateral Near:     Physical Exam Vitals and nursing note reviewed.  Constitutional:      Appearance: Normal appearance. He is well-developed and well-groomed.  HENT:     Head: Normocephalic.  Cardiovascular:     Rate and Rhythm: Regular rhythm. Bradycardia present.     Pulses: Normal pulses.     Heart sounds: Normal heart sounds.  Pulmonary:     Effort: Pulmonary effort is normal.  Musculoskeletal:     Left hand: Swelling and bony tenderness present. Decreased range of motion. Normal strength. Normal sensation. There is no disruption of two-point discrimination. Normal capillary refill. Normal pulse.       Feet:     Comments: Left index finger discolored,swollen at MIP  Feet:     Comments: No swelling,no erythema, Dp +2 Neurological:     General: No focal deficit present.     Mental Status: He is alert and oriented to person, place, and time.     GCS: GCS eye subscore is 4. GCS verbal subscore is 5. GCS motor subscore is 6.     Sensory: Sensation is intact.     Coordination: Coordination is intact.     Gait: Gait is intact.     Comments: Decreased ROM  Psychiatric:        Attention and Perception: Attention normal.        Mood and Affect: Mood normal.        Speech: Speech normal.        Behavior: Behavior normal. Behavior is cooperative.     UC Treatments / Results  Labs (all labs ordered are listed, but only abnormal results are displayed) Labs Reviewed - No data to display  EKG   Radiology DG Finger Index Left  Result Date: 06/15/2023 CLINICAL DATA:  Pain.  Jammed finger playing basketball.  EXAM: LEFT INDEX FINGER 2+V COMPARISON:  None Available. FINDINGS: There is a nondisplaced, intra-articular fracture through the volar aspect of the base of the middle phalanx of the index finger. Soft tissue swelling is noted.  There is no dislocation. IMPRESSION: Nondisplaced intra-articular fracture of the index finger middle phalanx. Electronically Signed   By: Sebastian Ache M.D.   On: 06/15/2023 10:45   DG Foot Complete Right  Result Date: 06/15/2023 CLINICAL DATA:  Right foot/heel pain. EXAM: RIGHT FOOT COMPLETE - 3+ VIEW COMPARISON:  Right foot radiographs 08/01/2010 FINDINGS: No acute fracture or dislocation is identified. A small posterior calcaneal spur is noted. Mild-to-moderate tibiotalar spurring is also noted. No destructive osseous lesion is identified. The soft tissues are unremarkable. IMPRESSION: No acute osseous abnormality. Degenerative changes at the ankle and small posterior calcaneal spur. Electronically Signed   By: Sebastian Ache M.D.   On: 06/15/2023 10:26    Procedures Procedures (including critical care time)  Medications Ordered in UC Medications - No data to display  Initial Impression / Assessment and Plan / UC Course  I have reviewed the triage vital signs and the nursing notes.  Pertinent labs & imaging results that were available during my care of the patient were reviewed by me and considered in my medical decision making (see chart for details).  Clinical Course as of 06/15/23 1621  Sun Jun 15, 2023  1019 Left index finger xray d/t injury while playing basketball, right foot/heel pain(xray pending) [JD]  1109 Left finger splint for fracture, reviewed xray results fx and heel spur, will need to refer back to [facility] for orthopedics/podiatry for further management. Pt verbalized understanding to this provider. [JD]    Clinical Course User Index [JD] Laiyah Exline, Para March, NP   Buddy tape left index finger, no finger splint available. Pt tolerated well. Referred to  Orthopedics. Pt verbalized understanding to this provider.   Ddx: heel spur,plantar fasciitis, index finger fracture,sprain Final Clinical Impressions(s) / UC Diagnoses   Final diagnoses:  Finger pain, left  Pain of right heel  Closed nondisplaced fracture of middle phalanx of left index finger, initial encounter  Heel spur, right     Discharge Instructions      Rest,ice,wear splint, follow up with [facility]-call for appt or walk in for further evaluation of left index finger fracture(intraarticular,nondisplaced). May take over the counter tylenol/ibuprofen as label directed for pain.  Heel spur right foot, follow up with ortho or podiatry.      ED Prescriptions   None    PDMP not reviewed this encounter.   Clancy Gourd, NP 06/15/23 1621

## 2023-06-15 NOTE — ED Triage Notes (Signed)
Pt c/o right heel pain and jammed finger on left hand x2days  Pt was playing basketball and injured the 2nd finger on the left hand on 06/14/23  Pt states that the pain is along the lateral side of the heel and right foot.  Pt states that he only feels the pain when he's walking or when he touches it.   Pt feels more pain when barefoot.   Pt states that the pain started as he was playing basketball.  Pt denies shooting pain up the leg or calf and states that the pain is only in the foot.

## 2023-06-15 NOTE — Discharge Instructions (Addendum)
Rest,ice,wear splint, follow up with [facility]-call for appt or walk in for further evaluation of left index finger fracture(intraarticular,nondisplaced). May take over the counter tylenol/ibuprofen as label directed for pain.  Heel spur right foot, follow up with ortho or podiatry.

## 2023-10-18 ENCOUNTER — Other Ambulatory Visit: Payer: Self-pay

## 2023-10-18 ENCOUNTER — Encounter (HOSPITAL_BASED_OUTPATIENT_CLINIC_OR_DEPARTMENT_OTHER): Payer: Self-pay

## 2023-10-18 ENCOUNTER — Emergency Department (HOSPITAL_BASED_OUTPATIENT_CLINIC_OR_DEPARTMENT_OTHER)
Admission: EM | Admit: 2023-10-18 | Discharge: 2023-10-18 | Disposition: A | Discharge status: Home / Self Care | Payer: No Typology Code available for payment source | Attending: Emergency Medicine | Admitting: Emergency Medicine

## 2023-10-18 ENCOUNTER — Emergency Department (HOSPITAL_BASED_OUTPATIENT_CLINIC_OR_DEPARTMENT_OTHER): Payer: No Typology Code available for payment source

## 2023-10-18 DIAGNOSIS — Y9367 Activity, basketball: Secondary | ICD-10-CM | POA: Insufficient documentation

## 2023-10-18 DIAGNOSIS — X58XXXA Exposure to other specified factors, initial encounter: Secondary | ICD-10-CM | POA: Insufficient documentation

## 2023-10-18 DIAGNOSIS — S0990XA Unspecified injury of head, initial encounter: Secondary | ICD-10-CM | POA: Diagnosis present

## 2023-10-18 DIAGNOSIS — S0219XA Other fracture of base of skull, initial encounter for closed fracture: Secondary | ICD-10-CM | POA: Insufficient documentation

## 2023-10-18 MED ORDER — IBUPROFEN 800 MG PO TABS
800.0000 mg | ORAL_TABLET | Freq: Once | ORAL | Status: AC
  Administered 2023-10-18: 800 mg via ORAL
  Filled 2023-10-18: qty 1

## 2023-10-18 MED ORDER — TETRACAINE HCL 0.5 % OP SOLN
2.0000 [drp] | Freq: Once | OPHTHALMIC | Status: AC
  Administered 2023-10-18: 2 [drp] via OPHTHALMIC
  Filled 2023-10-18: qty 4

## 2023-10-18 MED ORDER — FLUORESCEIN SODIUM 1 MG OP STRP
1.0000 | ORAL_STRIP | Freq: Once | OPHTHALMIC | Status: AC
  Administered 2023-10-18: 1 via OPHTHALMIC
  Filled 2023-10-18: qty 1

## 2023-10-18 NOTE — ED Provider Notes (Signed)
[place] EMERGENCY DEPARTMENT AT [facility] Provider Note   CSN: [id number] Arrival date & time: 10/18/23  1745     History  Chief Complaint  Patient presents with   Eye Injury    Sean Bush is a 42 y.o. male with overall noncontributory past medical history who presents with concern for eye pain, swelling since getting elbowed in the eye earlier today.  Patient reports that he was playing basketball and took an elbow to the left eye.  He reports that when he blew his nose earlier he felt like there was some air coming from behind the eye, he reports that he feels like "his eyes going to pop out".  He reports some slight dizziness, mild nausea, he denied loss of consciousness at the time of the injury.  He took some Tylenol earlier for pain with mild relief.   Eye Injury       Home Medications Prior to Admission medications   Medication Sig Start Date End Date Taking? Authorizing Provider  buPROPion (WELLBUTRIN XL) 150 MG 24 hr tablet TAKE ONE TABLET BY MOUTH EVERY MORNING FOR DEPRESSION 01/16/23   [provider]  docusate sodium (COLACE) 100 MG capsule Take 1 capsule (100 mg total) by mouth 3 (three) times daily as needed. Patient not taking: Reported on 02/15/2023 05/30/14   Jiles Harold, PA-C  FLUoxetine (PROZAC) 20 MG capsule Take 1 tablet by mouth daily. 11/22/22   [provider]  fluticasone (FLONASE) 50 MCG/ACT nasal spray INSTILL 2 SPRAYS IN EACH NOSTRIL DAILY AS NEEDED FOR ALLERGIES 07/22/22 07/23/23  [provider]  hydrOXYzine (ATARAX) 10 MG tablet TAKE ONE TABLET BY MOUTH EVERY 6 HOURS AS NEEDED FOR ANXIETY/AGITATION 11/22/22   [provider]  oxyCODONE-acetaminophen (ROXICET) 5-325 MG per tablet Take 1-2 tablets by mouth every 4 (four) hours as needed for severe pain. Patient not taking: Reported on 02/15/2023 05/30/14   Jiles Harold, PA-C      Allergies    Patient has no known allergies.    Review of  Systems   Review of Systems  All other systems reviewed and are negative.   Physical Exam Updated Vital Signs BP (!) 144/96   Pulse 84   Temp 98.6 F (37 C) (Oral)   Resp 15   SpO2 96%  Physical Exam Vitals and nursing note reviewed.  Constitutional:      General: He is not in acute distress.    Appearance: Normal appearance.  HENT:     Head: Normocephalic and atraumatic.  Eyes:     General:        Right eye: No discharge.        Left eye: No discharge.     Comments: Extraocular movements are intact bilaterally, no proptosis bilaterally.  There is some ecchymosis at the inner corner of the left eye, no subconjunctival hemorrhage noted, fluorescein exam reveals no corneal abrasion, intraocular pressure 15 mmHg on the left.  Cardiovascular:     Rate and Rhythm: Normal rate and regular rhythm.  Pulmonary:     Effort: Pulmonary effort is normal. No respiratory distress.  Musculoskeletal:        General: No deformity.  Skin:    General: Skin is warm and dry.  Neurological:     Mental Status: He is alert and oriented to person, place, and time.     Comments: Moves all 4 limbs spontaneously, CN II through XII grossly intact, can ambulate without difficulty, intact sensation throughout.   Psychiatric:  Mood and Affect: Mood normal.        Behavior: Behavior normal.     ED Results / Procedures / Treatments   Labs (all labs ordered are listed, but only abnormal results are displayed) Labs Reviewed - No data to display  EKG None  Radiology CT Maxillofacial Wo Contrast  Result Date: 10/18/2023 CLINICAL DATA:  Elbowed in the left eye, pain ecchymosis EXAM: CT HEAD WITHOUT CONTRAST CT MAXILLOFACIAL WITHOUT CONTRAST TECHNIQUE: Multidetector CT imaging of the head and maxillofacial structures were performed using the standard protocol without intravenous contrast. Multiplanar CT image reconstructions of the maxillofacial structures were also generated. RADIATION DOSE  REDUCTION: This exam was performed according to the departmental dose-optimization program which includes automated exposure control, adjustment of the mA and/or kV according to patient size and/or use of iterative reconstruction technique. COMPARISON:  None Available. FINDINGS: CT HEAD FINDINGS Brain: No evidence of acute infarct, hemorrhage, mass, mass effect, or midline shift. No hydrocephalus or extra-axial fluid collection. Vascular: No hyperdense vessel. Skull: Negative for fracture or focal lesion. CT MAXILLOFACIAL FINDINGS Osseous: For orbital findings, see below. No other fracture or mandibular dislocation. No destructive process. Orbits: Left lamina papyracea fracture, which is age indeterminate but favored to be acute, with herniation of orbital fat into the ethmoid air cells. The left medial rectus muscle appears deviated into the defect (series 6, image 47). Air is noted in the fat of the left superior extraconal space and preseptal space (series 2, image 78). No evidence of air in the intraconal space. The right orbit is unremarkable. Sinuses: Minimal mucosal thickening in the ethmoid air cells. Left lamina papyracea fracture, as described above Soft tissues: Small hematoma in the left eyebrow and periorbital soft tissues. IMPRESSION: 1. No acute intracranial process. 2. Left lamina papyracea fracture, which is age indeterminate but favored to be acute, with herniation of orbital fat into the ethmoid air cells. The left medial rectus muscle appears deviated into the defect. Correlate with physical exam. 3. Air is noted in the fat of the left superior extraconal space and preseptal space. No evidence of air in the intraconal space. 4. Small hematoma in the left eyebrow and periorbital soft tissues. Electronically Signed   By: Wiliam Ke M.D.   On: 10/18/2023 20:10   CT Head Wo Contrast  Result Date: 10/18/2023 CLINICAL DATA:  Elbowed in the left eye, pain ecchymosis EXAM: CT HEAD WITHOUT  CONTRAST CT MAXILLOFACIAL WITHOUT CONTRAST TECHNIQUE: Multidetector CT imaging of the head and maxillofacial structures were performed using the standard protocol without intravenous contrast. Multiplanar CT image reconstructions of the maxillofacial structures were also generated. RADIATION DOSE REDUCTION: This exam was performed according to the departmental dose-optimization program which includes automated exposure control, adjustment of the mA and/or kV according to patient size and/or use of iterative reconstruction technique. COMPARISON:  None Available. FINDINGS: CT HEAD FINDINGS Brain: No evidence of acute infarct, hemorrhage, mass, mass effect, or midline shift. No hydrocephalus or extra-axial fluid collection. Vascular: No hyperdense vessel. Skull: Negative for fracture or focal lesion. CT MAXILLOFACIAL FINDINGS Osseous: For orbital findings, see below. No other fracture or mandibular dislocation. No destructive process. Orbits: Left lamina papyracea fracture, which is age indeterminate but favored to be acute, with herniation of orbital fat into the ethmoid air cells. The left medial rectus muscle appears deviated into the defect (series 6, image 47). Air is noted in the fat of the left superior extraconal space and preseptal space (series 2, image 78). No evidence of  air in the intraconal space. The right orbit is unremarkable. Sinuses: Minimal mucosal thickening in the ethmoid air cells. Left lamina papyracea fracture, as described above Soft tissues: Small hematoma in the left eyebrow and periorbital soft tissues. IMPRESSION: 1. No acute intracranial process. 2. Left lamina papyracea fracture, which is age indeterminate but favored to be acute, with herniation of orbital fat into the ethmoid air cells. The left medial rectus muscle appears deviated into the defect. Correlate with physical exam. 3. Air is noted in the fat of the left superior extraconal space and preseptal space. No evidence of air in  the intraconal space. 4. Small hematoma in the left eyebrow and periorbital soft tissues. Electronically Signed   By: Wiliam Ke M.D.   On: 10/18/2023 20:10    Procedures Procedures    Medications Ordered in ED Medications  tetracaine (PONTOCAINE) 0.5 % ophthalmic solution 2 drop (2 drops Left Eye Given 10/18/23 1928)  fluorescein ophthalmic strip 1 strip (1 strip Left Eye Given 10/18/23 1928)  ibuprofen (ADVIL) tablet 800 mg (800 mg Oral Given 10/18/23 2007)    ED Course/ Medical Decision Making/ A&P                                 Medical Decision Making Risk Prescription drug management.   This patient is a 42 y.o. male  who presents to the ED for concern of traumatic left eye injury.   Differential diagnoses prior to evaluation: The emergent differential diagnosis includes, but is not limited to, orbital blowout fracture, open globe injury, corneal abrasion, corneal ulceration, foreign body in eye, versus other. This is not an exhaustive differential.   Past Medical History / Co-morbidities / Social History: Overall noncontributory  Physical Exam: Physical exam performed. The pertinent findings include: Patient with some bruising on the inner corner of the left eye, Extraocular movements are intact bilaterally, no proptosis bilaterally.  There is some ecchymosis at the inner corner of the left eye, no subconjunctival hemorrhage noted, fluorescein exam reveals no corneal abrasion, intraocular pressure 15 mmHg on the left.  Moves all 4 limbs spontaneously, CN II through XII grossly intact, can ambulate without difficulty, intact sensation throughout.  Lab Tests/Imaging studies: I personally interpreted labs/imaging and the pertinent results include: Independently interpreted CT maxillofacial without contrast and CT head without contrast which showed.  Left lamina papyracea fracture, herniation of fat, some soft tissue free air noted, no noted extraocular movement entrapment.   I agree with the radiologist interpretation.   Medications: I ordered medication including ibuprofen for pain.  I have reviewed the patients home medicines and have made adjustments as needed.   Consult: Spoke with the oral maxillofacial surgeon, Dr. Ross Marcus who recommends no acute treatment, discussed return precautions, avoiding contact sports, blowing nose, follow up in 2 weeks  Disposition: After consideration of the diagnostic results and the patients response to treatment, I feel that patient is stable for discharge with plan as above .   emergency department workup does not suggest an emergent condition requiring admission or immediate intervention beyond what has been performed at this time. The plan is: as above. The patient is safe for discharge and has been instructed to return immediately for worsening symptoms, change in symptoms or any other concerns.  Final Clinical Impression(s) / ED Diagnoses Final diagnoses:  Closed fracture of orbital plate of ethmoid bone, initial encounter (HCC)    Rx / DC Orders ED  Discharge Orders     None         [place] 10/18/23 2055    Lonell Grandchild, MD 10/19/23 [phone]

## 2023-10-18 NOTE — ED Notes (Signed)
-  Called ENT-Trauma Dr. Ross Marcus at 829pm and transferred to PA.

## 2023-10-18 NOTE — ED Triage Notes (Signed)
He states he was "elbowed" in his left eye. He c/o pain and ecchymosis of lat. Left eye. He is ambulatory and in no distress.

## 2023-10-18 NOTE — Discharge Instructions (Addendum)
Please use Tylenol or ibuprofen for pain.  You may use 600 mg ibuprofen every 6 hours or 1000 mg of Tylenol every 6 hours.  You may choose to alternate between the 2.  This would be most effective.  Not to exceed 4 g of Tylenol within 24 hours.  Not to exceed 3200 mg ibuprofen 24 hours.  Avoid blowing your nose or contact sports. I would refrain from heavy lifting for at least 3-5 days, and avoid any physical activity thereafter that increases the pressure in your eye.   If you have sudden onset double vision, noticed that you are unable to look in all directions, or have severe worsening pain I recommend that you return for further evaluation as you are likely to need surgical management.  You may want to go to one of our larger hospitals, [facility] if you do have worsening symptoms so that the ENT doctors will be onsite.
